# Patient Record
Sex: Male | Born: 1985 | Race: Black or African American | Hispanic: No | Marital: Single | Smoking: Current every day smoker
Health system: Southern US, Community
[De-identification: ages and names within clinical notes are randomized; demographics above are authoritative.]

---
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
## 2011-06-27 ENCOUNTER — Emergency Department (HOSPITAL_COMMUNITY)
Admission: EM | Admit: 2011-06-27 | Discharge: 2011-06-27 | Disposition: A | Discharge status: Home / Self Care | Payer: No Typology Code available for payment source | Attending: Emergency Medicine | Admitting: Emergency Medicine

## 2011-06-27 ENCOUNTER — Emergency Department (HOSPITAL_COMMUNITY): Payer: No Typology Code available for payment source

## 2011-06-27 ENCOUNTER — Emergency Department (HOSPITAL_COMMUNITY): Payer: Self-pay

## 2011-06-27 DIAGNOSIS — IMO0002 Reserved for concepts with insufficient information to code with codable children: Secondary | ICD-10-CM | POA: Insufficient documentation

## 2011-06-27 DIAGNOSIS — M25569 Pain in unspecified knee: Secondary | ICD-10-CM | POA: Insufficient documentation

## 2011-06-27 DIAGNOSIS — S0100XA Unspecified open wound of scalp, initial encounter: Secondary | ICD-10-CM | POA: Insufficient documentation

## 2011-06-27 LAB — POCT I-STAT, CHEM 8
Calcium, Ion: 1.11 mmol/L — ABNORMAL LOW (ref 1.12–1.32)
Creatinine, Ser: 1.4 mg/dL — ABNORMAL HIGH (ref 0.50–1.35)
Glucose, Bld: 107 mg/dL — ABNORMAL HIGH (ref 70–99)
HCT: 45 % (ref 39.0–52.0)
Hemoglobin: 15.3 g/dL (ref 13.0–17.0)
Potassium: 3.3 mEq/L — ABNORMAL LOW (ref 3.5–5.1)
TCO2: 22 mmol/L (ref 0–100)

## 2011-07-06 ENCOUNTER — Emergency Department (HOSPITAL_COMMUNITY)
Admission: EM | Admit: 2011-07-06 | Discharge: 2011-07-06 | Disposition: A | Discharge status: Home / Self Care | Payer: No Typology Code available for payment source | Attending: Emergency Medicine | Admitting: Emergency Medicine

## 2011-07-06 DIAGNOSIS — Z79899 Other long term (current) drug therapy: Secondary | ICD-10-CM | POA: Insufficient documentation

## 2011-07-06 DIAGNOSIS — M25569 Pain in unspecified knee: Secondary | ICD-10-CM | POA: Insufficient documentation
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
## 2013-09-04 ENCOUNTER — Emergency Department (HOSPITAL_COMMUNITY)
Admission: EM | Admit: 2013-09-04 | Discharge: 2013-09-04 | Disposition: A | Discharge status: Home / Self Care | Payer: Self-pay | Attending: Emergency Medicine | Admitting: Emergency Medicine

## 2013-09-04 ENCOUNTER — Encounter (HOSPITAL_COMMUNITY): Payer: Self-pay | Admitting: Emergency Medicine

## 2013-09-04 DIAGNOSIS — F17200 Nicotine dependence, unspecified, uncomplicated: Secondary | ICD-10-CM | POA: Insufficient documentation

## 2013-09-04 DIAGNOSIS — K047 Periapical abscess without sinus: Secondary | ICD-10-CM | POA: Insufficient documentation

## 2013-09-04 MED ORDER — OXYCODONE-ACETAMINOPHEN 5-325 MG PO TABS: 2.0000 | ORAL_TABLET | ORAL | Status: AC | PRN

## 2013-09-04 MED ORDER — AMOXICILLIN 500 MG PO CAPS: 500.0000 mg | ORAL_CAPSULE | Freq: Three times a day (TID) | ORAL | Status: AC

## 2013-09-04 MED ORDER — BUPIVACAINE-EPINEPHRINE PF 0.5-1:200000 % IJ SOLN
1.8000 mL | Freq: Once | INTRAMUSCULAR | Status: DC
  Filled 2013-09-04: qty 1.8

## 2013-09-04 NOTE — ED Notes (Signed)
Pt c/o left lower dental pain from bad tooth 

## 2013-09-04 NOTE — ED Provider Notes (Signed)
CSN: [id number]     Arrival date & time 09/04/13  1142 History   This chart was scribed for non-physician practitioner Wynetta Emery, PA-C working with Loren Racer, MD by Leone Payor, ED Scribe. This patient was seen in room TR09C/TR09C and the patient's care was started at 1142.   Chief Complaint  Patient presents with  . Dental Pain    The history is provided by the patient. No language interpreter was used.    HPI Comments: Ricardo Smith. is a 27 y.o. male who presents to the Emergency Department complaining of 1 day of gradual onset, gradually worsening, constant left lower dental pain with associated gum swelling. He rates the pain as 7/10 currently. He reports having tooth problems in the same area. He has mild difficulty opening mouth due to the swelling. He denies fever, nausea, emesis. He denies allergies to any medications.   Pt is a current everyday smoker and occasional alcohol user.   History reviewed. No pertinent past medical history. History reviewed. No pertinent past surgical history. History reviewed. No pertinent family history. History  Substance Use Topics  . Smoking status: Current Every Day Smoker  . Smokeless tobacco: Not on file  . Alcohol Use: Yes    Review of Systems A complete 10 system review of systems was obtained and all systems are negative except as noted in the HPI and PMH.   Allergies  Review of patient's allergies indicates no known allergies.  Home Medications   Current Outpatient Rx  Name  Route  Sig  Dispense  Refill  . benzocaine (ORAJEL) 10 % mucosal gel   Mouth/Throat   Use as directed 1 application in the mouth or throat as needed for pain.         Marland Kitchen ibuprofen (ADVIL,MOTRIN) 200 MG tablet   Oral   Take 400 mg by mouth every 6 (six) hours as needed for pain.          BP 153/89  Pulse 97  Temp(Src) 97.9 F (36.6 C) (Oral)  Resp 18  SpO2 97% Physical Exam  Nursing note and vitals reviewed. Constitutional: He  is oriented to person, place, and time. He appears well-developed and well-nourished. No distress.  HENT:  Head: Normocephalic.  Left mandibular swelling.   Fluctuant abscess to left lateral inferior gum line.  Generally poor dentition, Patient is handling their secretions. There is no tenderness to palpation or firmness underneath tongue bilaterally. No trismus.    Eyes: Conjunctivae and EOM are normal.  Cardiovascular: Normal rate.   Pulmonary/Chest: Effort normal. No stridor.  Musculoskeletal: Normal range of motion.  Neurological: He is alert and oriented to person, place, and time.  Psychiatric: He has a normal mood and affect.    ED Course  Procedures (including critical care time)  DIAGNOSTIC STUDIES: Oxygen Saturation is 97% on RA, normal by my interpretation.    COORDINATION OF CARE: 3:30 PM Will perform an I&D to relieve pressure from the dental abscess. Will prescribe antibiotics and pain medication. Discussed treatment plan with pt at bedside and pt agreed to plan.   3:46 PM  INCISION AND DRAINAGE Performed by: Wynetta Emery, PA-C Consent: Verbal consent obtained. Risks and benefits: risks, benefits and alternatives were discussed Type: abscess  Body area: left lower dental   Anesthesia: left sided inferior alveolar block   Incision was made with a scalpel.  Local anesthetic: bupivacaine 0.5% with epinephrine  Anesthetic total: 1.8 ml  Complexity: complex Blunt dissection to break up  loculations  Drainage: purulent  Drainage amount: 7 ml  Packing material: None.   Patient tolerance: Patient tolerated the procedure well with no immediate complications.   Labs Review Labs Reviewed - No data to display Imaging Review No results found.  MDM  No diagnosis found.  Filed Vitals:   09/04/13 1144 09/04/13 1532  BP: 153/89 156/94  Pulse: 97 79  Temp: 97.9 F (36.6 C) 97.5 F (36.4 C)  TempSrc: Oral Oral  Resp: 18 16  SpO2: 97% 98%      [name] is a 27 y.o. male with left lower dental abscess. Patient given inferior alveolar block and abscess opened with drainage of significant amount of purulent material. Patient will be started on antibiotics, given pain medication and asked to follow with a dentist.  Pt is hemodynamically stable, appropriate for, and amenable to discharge at this time. Pt verbalized understanding and agrees with care plan. All questions answered. Outpatient follow-up and specific return precautions discussed.    Discharge Medication List as of 09/04/2013  3:48 PM    START taking these medications   Details  amoxicillin (AMOXIL) 500 MG capsule Take 1 capsule (500 mg total) by mouth 3 (three) times daily., Starting 09/04/2013, Until Discontinued, Print    oxyCODONE-acetaminophen (PERCOCET/ROXICET) 5-325 MG per tablet Take 2 tablets by mouth every 4 (four) hours as needed for pain., Starting 09/04/2013, Until Discontinued, Print        I personally performed the services described in this documentation, which was scribed in my presence. The recorded information has been reviewed and is accurate.  Note: Portions of this report may have been transcribed using voice recognition software. Every effort was made to ensure accuracy; however, inadvertent computerized transcription errors may be present    Wynetta Emery, PA-C 09/06/13 1949

## 2013-09-04 NOTE — ED Notes (Signed)
Bupivacaine-epinephrine given by Sarita Bottom, PA

## 2013-09-07 NOTE — ED Provider Notes (Signed)
Medical screening examination/treatment/procedure(s) were performed by non-physician practitioner and as supervising physician I was immediately available for consultation/collaboration.   Riaan Toledo, MD 09/07/13 1540 

## 2014-11-10 ENCOUNTER — Encounter (HOSPITAL_COMMUNITY): Payer: Self-pay | Admitting: *Deleted

## 2014-11-10 ENCOUNTER — Emergency Department (HOSPITAL_COMMUNITY)
Admission: EM | Admit: 2014-11-10 | Discharge: 2014-11-10 | Disposition: A | Discharge status: Home / Self Care | Payer: BC Managed Care – PPO | Attending: Emergency Medicine | Admitting: Emergency Medicine

## 2014-11-10 DIAGNOSIS — R220 Localized swelling, mass and lump, head: Secondary | ICD-10-CM | POA: Diagnosis present

## 2014-11-10 DIAGNOSIS — Z720 Tobacco use: Secondary | ICD-10-CM | POA: Insufficient documentation

## 2014-11-10 DIAGNOSIS — K047 Periapical abscess without sinus: Secondary | ICD-10-CM | POA: Insufficient documentation

## 2014-11-10 DIAGNOSIS — K029 Dental caries, unspecified: Secondary | ICD-10-CM | POA: Insufficient documentation

## 2014-11-10 DIAGNOSIS — Z792 Long term (current) use of antibiotics: Secondary | ICD-10-CM | POA: Diagnosis not present

## 2014-11-10 MED ORDER — PENICILLIN V POTASSIUM 500 MG PO TABS: 500.0000 mg | ORAL_TABLET | Freq: Three times a day (TID) | ORAL | Status: AC

## 2014-11-10 MED ORDER — LIDOCAINE HCL 2 % IJ SOLN
5.0000 mL | Freq: Once | INTRAMUSCULAR | Status: AC
  Administered 2014-11-10: 100 mg
  Filled 2014-11-10: qty 20

## 2014-11-10 MED ORDER — PENICILLIN V POTASSIUM 250 MG PO TABS
500.0000 mg | ORAL_TABLET | Freq: Once | ORAL | Status: AC
  Administered 2014-11-10: 500 mg via ORAL
  Filled 2014-11-10: qty 2

## 2014-11-10 MED ORDER — BUPIVACAINE-EPINEPHRINE (PF) 0.5% -1:200000 IJ SOLN
1.8000 mL | Freq: Once | INTRAMUSCULAR | Status: DC
  Filled 2014-11-10: qty 1.8

## 2014-11-10 MED ORDER — HYDROCODONE-ACETAMINOPHEN 5-325 MG PO TABS: ORAL_TABLET | ORAL | Status: AC

## 2014-11-10 MED ORDER — BUPIVACAINE-EPINEPHRINE (PF) 0.5% -1:200000 IJ SOLN: 1.8000 mL | Freq: Once | INTRAMUSCULAR | Status: DC

## 2014-11-10 NOTE — Discharge Instructions (Signed)
Please read and follow all provided instructions.  Your diagnoses today include:  1. Dental abscess    The exam and treatment you received today has been provided on an emergency basis only. This is not a substitute for complete medical or dental care.  Tests performed today include:  Vital signs. See below for your results today.   Medications prescribed:   Penicillin - antibiotic  You have been prescribed an antibiotic medicine: take the entire course of medicine even if you are feeling better. Stopping early can cause the antibiotic not to work.   Vicodin (hydrocodone/acetaminophen) - narcotic pain medication  DO NOT drive or perform any activities that require you to be awake and alert because this medicine can make you drowsy. BE VERY CAREFUL not to take multiple medicines containing Tylenol (also called acetaminophen). Doing so can lead to an overdose which can damage your liver and cause liver failure and possibly death.  Take any prescribed medications only as directed.  Home care instructions:  Follow any educational materials contained in this packet.  Follow-up instructions: Please follow-up with your dentist for further evaluation of your symptoms.   Dental Assistance: See below for dental referrals  Return instructions:   Please return to the Emergency Department if you experience worsening symptoms.  Please return if you develop a fever, you develop more swelling in your face or neck, you have trouble breathing or swallowing food.  Please return if you have any other emergent concerns.  Additional Information:  Your vital signs today were: BP 147/83 mmHg   Pulse 100   Temp(Src) 99.2 F (37.3 C) (Oral)   Resp 22   Ht 6\' 2"  (1.88 m)   Wt 165 lb (74.844 kg)   BMI 21.18 kg/m2   SpO2 99% If your blood pressure (BP) was elevated above 135/85 this visit, please have this repeated by your doctor within one month. -------------- Dental Care: Organization          Address  Phone  Notes  [facility] [address], [place] [phone] Accepts children up to age 29 who are enrolled in [place] or [place] Health Choice; pregnant women with a Medicaid card; and children who have applied for Medicaid or [place] Health Choice, but were declined, whose parents can pay a reduced fee at time of service.  [facility] [place]  [address], [place] [phone] Accepts children up to age 29 who are enrolled in [place] or [place] Health Choice; pregnant women with a Medicaid card; and children who have applied for Medicaid or [place] Health Choice, but were declined, whose parents can pay a reduced fee at time of service.  [facility]  [address], [place] [phone] Patients are seen by appointment only. Walk-ins are not accepted. [facility] will see patients 29 years of age and older. Monday - Tuesday (8am-5pm) Most Wednesdays (8:30-5pm) $30 per visit, cash only  [facility]  [address], [place] [facility] [place] [phone] Patients are seen by appointment only. Walk-ins are not accepted. [facility] will see patients 29 years of age and older. One Wednesday Evening (Monthly: Volunteer Based).  $30 per visit, cash only  [facility]  [phone] for adults; Children under age 29, call Graduate Pediatric Dentistry at [phone]. Children aged 474-14, please call [phone] to request a pediatric application.  Dental services are provided in all areas of dental care including fillings, crowns and bridges, complete and partial dentures, implants, gum treatment, root canals, and extractions. Preventive care is also provided. Treatment is provided to both adults and children. Patients are selected via a lottery and there is often a waiting list.   [facility] [address] [place]  [phone] [email or web address]   [facility] [address] [place], [place] [phone], Ext. [phone] Second and Fourth Thursday of each month, opens at 6:30 AM; Clinic ends at 9 AM.  Patients are seen on a first-come first-served basis, and a limited number are seen during each clinic.   [facility]  [address] [place], [place] [phone]   Eligibility Requirements You must have lived in [place], [place], or [place] counties for at least the last three months.   You cannot be eligible for state or federal sponsored [employer], including [employer], [place], or [employer].   You generally cannot be eligible for healthcare insurance through your employer.    How to apply: Eligibility screenings are held every Tuesday and Wednesday afternoon from 1:00 pm until 4:00 pm. You do not need an appointment for the interview!  [facility] [address] [place], [place]   [place]  [place]  [phone]

## 2014-11-10 NOTE — ED Provider Notes (Signed)
CSN: [id number]     Arrival date & time 11/10/14  0518 History   First MD Initiated Contact with Patient 11/10/14 0606     Chief Complaint  Patient presents with  . Oral Swelling     (Consider location/radiation/quality/duration/timing/severity/associated sxs/prior Treatment) HPI Comments: Patient presents with complaints of left lower facial and dental pain for the past 2 days. Condition has gradually become worse. Patient denies fever, nausea or vomiting. No trouble breathing or swallowing. No treatments prior to arrival other than ibuprofen and 1 tablet of Keflex given to him by a friend.   The history is provided by the patient.    History reviewed. No pertinent past medical history. History reviewed. No pertinent past surgical history. History reviewed. No pertinent family history. History  Substance Use Topics  . Smoking status: Current Every Day Smoker  . Smokeless tobacco: Not on file  . Alcohol Use: Yes    Review of Systems  Constitutional: Negative for fever.  HENT: Positive for dental problem and facial swelling. Negative for ear pain, sore throat and trouble swallowing.   Respiratory: Negative for shortness of breath and stridor.   Musculoskeletal: Negative for neck pain.  Skin: Negative for color change.  Neurological: Negative for headaches.      Allergies  Review of patient's allergies indicates no known allergies.  Home Medications   Prior to Admission medications   Medication Sig Start Date End Date Taking? Authorizing Provider  benzocaine (ORAJEL) 10 % mucosal gel Use as directed 1 application in the mouth or throat as needed for pain.   Yes Historical Provider, MD  ibuprofen (ADVIL,MOTRIN) 200 MG tablet Take 400 mg by mouth every 6 (six) hours as needed for pain.   Yes Historical Provider, MD  amoxicillin (AMOXIL) 500 MG capsule Take 1 capsule (500 mg total) by mouth 3 (three) times daily. Patient not taking: Reported on 11/10/2014 09/04/13   Joni ReiningNicole  Pisciotta, PA-C  oxyCODONE-acetaminophen (PERCOCET/ROXICET) 5-325 MG per tablet Take 2 tablets by mouth every 4 (four) hours as needed for pain. Patient not taking: Reported on 11/10/2014 09/04/13   Joni ReiningNicole Pisciotta, PA-C   BP 147/83 mmHg  Pulse 100  Temp(Src) 99.2 F (37.3 C) (Oral)  Resp 22  Ht 6\' 2"  (1.88 m)  Wt 165 lb (74.844 kg)  BMI 21.18 kg/m2  SpO2 99% Physical Exam  Constitutional: He appears well-developed and well-nourished.  HENT:  Head: Normocephalic and atraumatic.  Right Ear: Tympanic membrane, external ear and ear canal normal.  Left Ear: Tympanic membrane, external ear and ear canal normal.  Nose: Nose normal.  Mouth/Throat: Uvula is midline, oropharynx is clear and moist and mucous membranes are normal. No trismus in the jaw. Abnormal dentition. Dental caries present. No dental abscesses or uvula swelling. No tonsillar abscesses.  Patient with L maxillary facial swelling lateral to pre-molars. 2cm palpable abscess noted.   Eyes: Pupils are equal, round, and reactive to light.  Neck: Normal range of motion. Neck supple.  No neck swelling or Lugwig's angina  Neurological: He is alert.  Skin: Skin is warm and dry.  Psychiatric: He has a normal mood and affect.  Nursing note and vitals reviewed.   ED Course  Procedures (including critical care time) Labs Review Labs Reviewed - No data to display  Imaging Review No results found.   EKG Interpretation None       6:29 AM Patient seen and examined. Patient requests attempt at I&D of abscess.    Vital signs reviewed and are  as follows: BP 147/83 mmHg  Pulse 100  Temp(Src) 99.2 F (37.3 C) (Oral)  Resp 22  Ht 6\' 2"  (1.88 m)  Wt 165 lb (74.844 kg)  BMI 21.18 kg/m2  SpO2 99%  INCISION AND DRAINAGE Performed by: Carolee RotaGEIPLE,Antoine Vandermeulen S Consent: Verbal consent obtained. Risks and benefits: risks, benefits and alternatives were discussed Type: abscess  Body area: left face,overlying left mandible  Anesthesia:  local infiltration  Aspiration was performed with an 18-gauge needle and syringe.  Local anesthetic: lidocaine 2% without epinephrine, Bupivacaine 0.5% with epinephrine Anesthetic total: 4 ml  Complexity: complex  Drainage: purulent  Drainage amount: 2 mL  Packing material: none  Patient tolerance: Patient tolerated the procedure well with no immediate complications.   6:54 AM Patient counseled on use of narcotic pain medications. Counseled not to combine these medications with others containing tylenol. Urged not to drink alcohol, drive, or perform any other activities that requires focus while taking these medications. The patient verbalizes understanding and agrees with the plan.  Patient counseled to take prescribed medications as directed, return with worsening facial or neck swelling, and to follow-up with their dentist as soon as possible.     MDM   Final diagnoses:  Dental abscess   Patient with dental abscess. No fever. Exam unconcerning for Ludwig's angina or other deep tissue infection in neck.   As there is gum swelling, erythema, and facial swelling, will treat with antibiotic and pain medicine. Urged patient to follow-up with dentist.        Renne CriglerJoshua Brittley Regner, PA-C 11/10/14 [id number]  Tomasita CrumbleAdeleke Oni, MD 11/10/14 1438

## 2014-11-10 NOTE — ED Notes (Signed)
Pt c/o lower left dental pain. Pt presents with swelling to left side of face. Onset of pain and swelling was Wednesday night.
